# Patient Record
Sex: Female | Born: 1995 | Race: Black or African American | Hispanic: No | Marital: Single
Health system: Midwestern US, Academic
[De-identification: ages and names within clinical notes are randomized; demographics above are authoritative.]

## PROBLEM LIST (undated history)

## (undated) ENCOUNTER — Inpatient Hospital Stay (HOSPITAL_COMMUNITY): Payer: Self-pay

## (undated) DIAGNOSIS — H539 Unspecified visual disturbance: Secondary | ICD-10-CM

## (undated) DIAGNOSIS — F909 Attention-deficit hyperactivity disorder, unspecified type: Secondary | ICD-10-CM

## (undated) DIAGNOSIS — F99 Mental disorder, not otherwise specified: Secondary | ICD-10-CM

## (undated) DIAGNOSIS — Q249 Congenital malformation of heart, unspecified: Secondary | ICD-10-CM

## (undated) DIAGNOSIS — A749 Chlamydial infection, unspecified: Secondary | ICD-10-CM

## (undated) DIAGNOSIS — F419 Anxiety disorder, unspecified: Secondary | ICD-10-CM

## (undated) HISTORY — PX: CARDIAC SURGERY: SHX584

## (undated) HISTORY — DX: Chlamydial infection, unspecified: A74.9

## (undated) HISTORY — PX: NO PAST SURGERIES: SHX2092

## (undated) HISTORY — DX: Anxiety disorder, unspecified: F41.9

## (undated) HISTORY — DX: Mental disorder, not otherwise specified: F99

## (undated) HISTORY — DX: Congenital malformation of heart, unspecified: Q24.9

---
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
## 2016-08-25 ENCOUNTER — Encounter (HOSPITAL_COMMUNITY): Payer: Self-pay

## 2016-09-11 IMAGING — US US MFM OB DETAIL+14 WK
1 of 2 series · 13 of 28 positions shown · non-contrast
Comparison: none

[Series 1: us mfm ob detail+14 wk · 131 acquisitions, 13 frames shown]
[im 6/131]
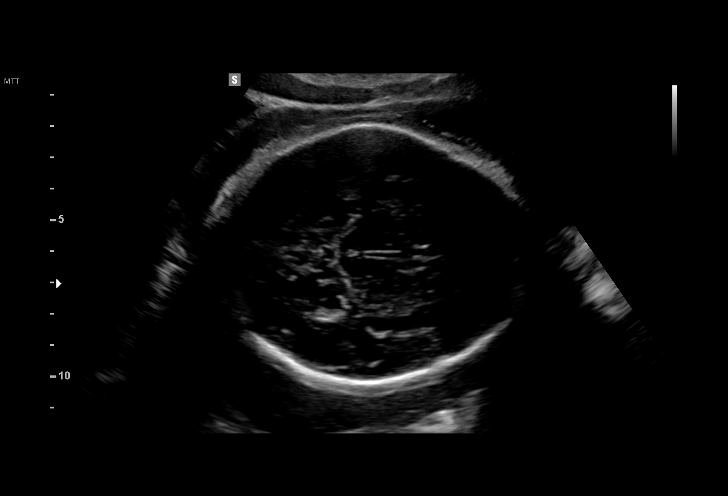
[im 16/131]
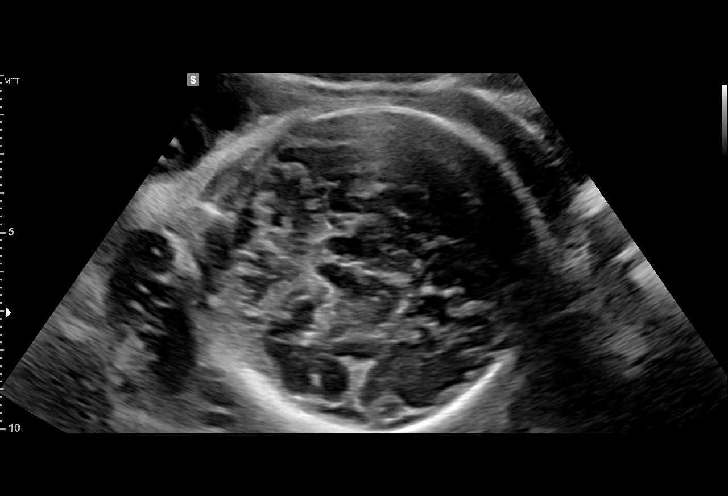
[im 26/131]
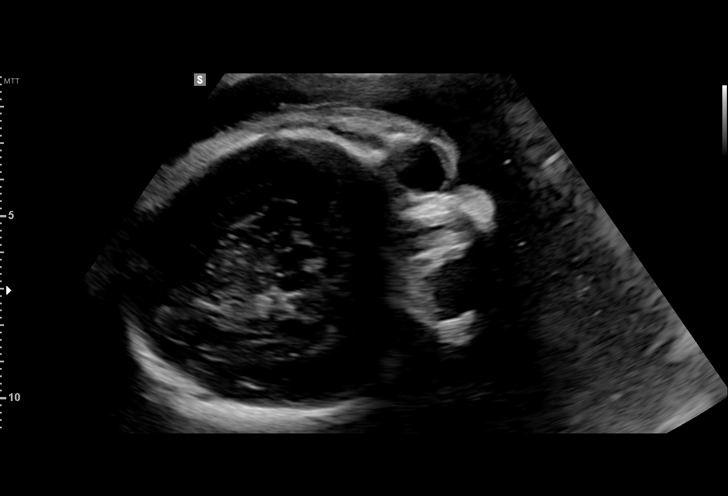
[im 36/131]
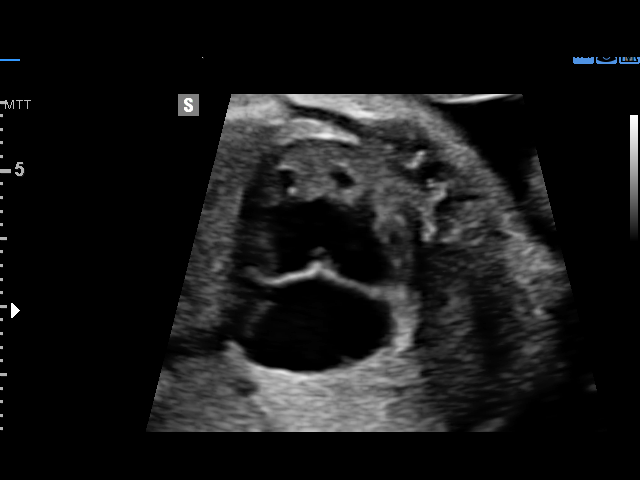
[im 46/131]
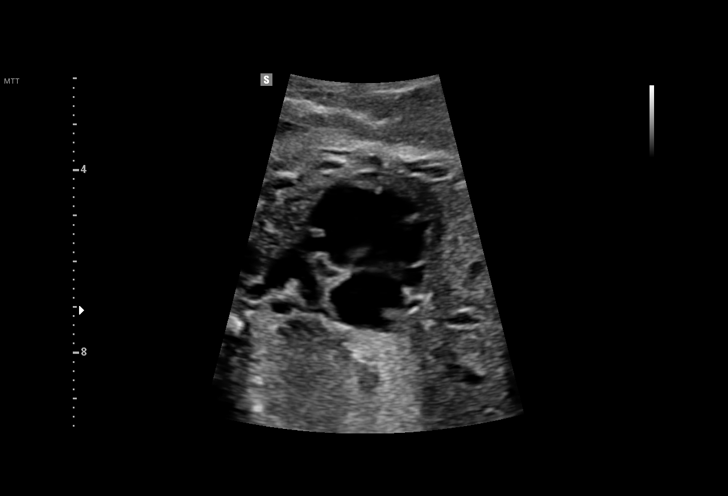
[im 56/131]
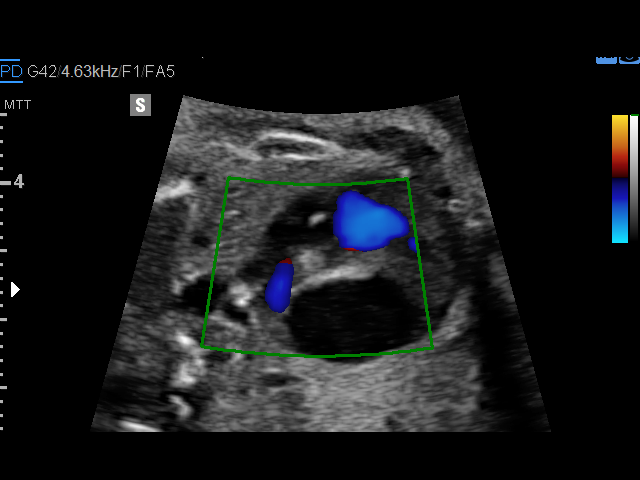
[im 71/131]
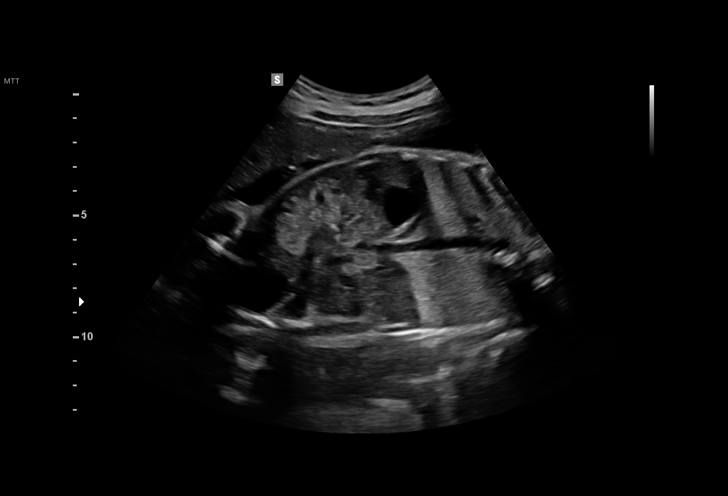
[im 81/131]
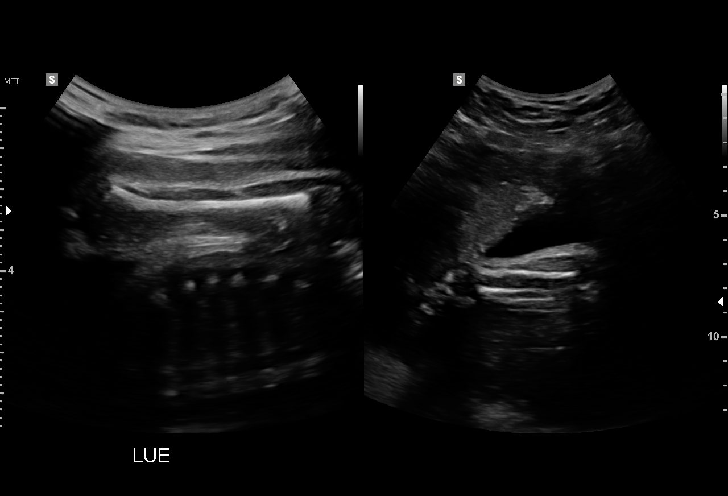
[im 91/131]
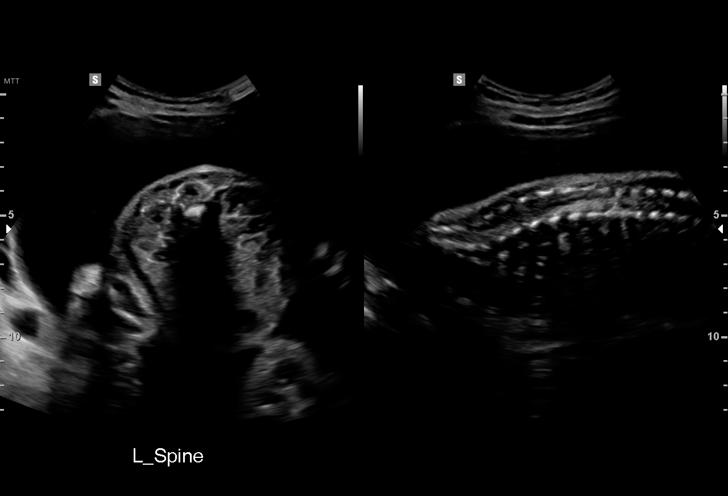
[im 101/131]
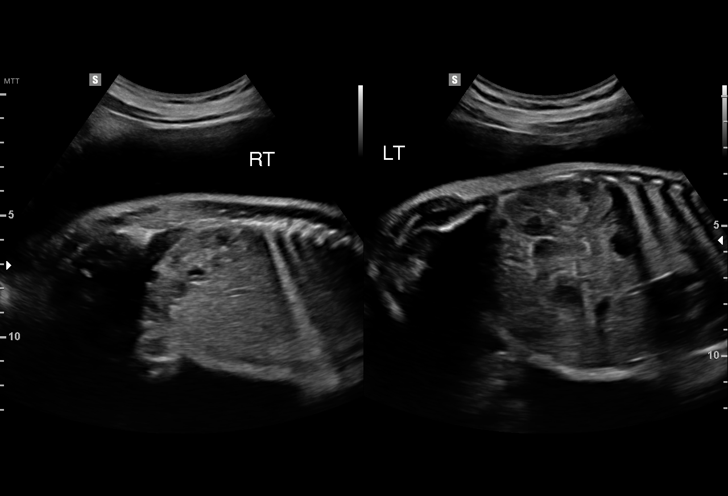
[im 111/131]
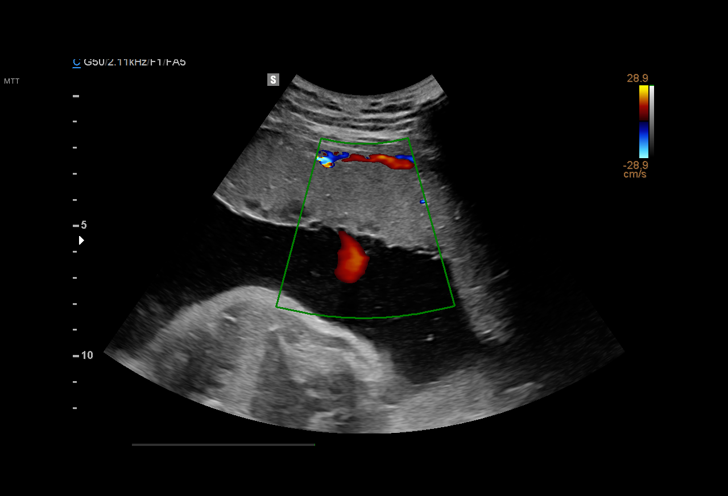
[im 121/131]
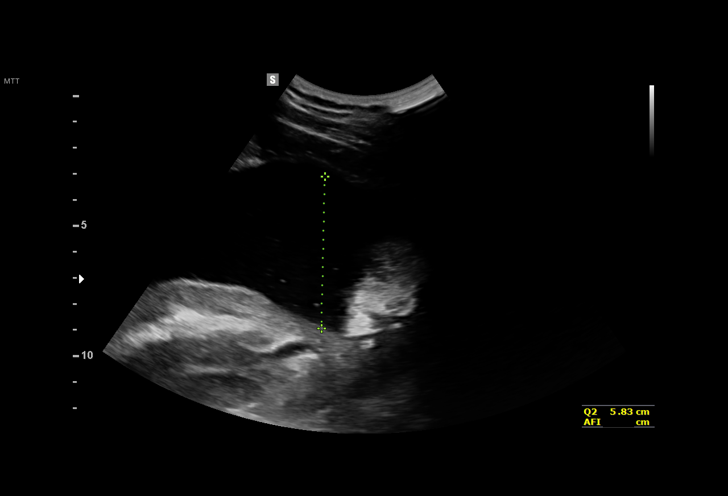
[im 131/131]
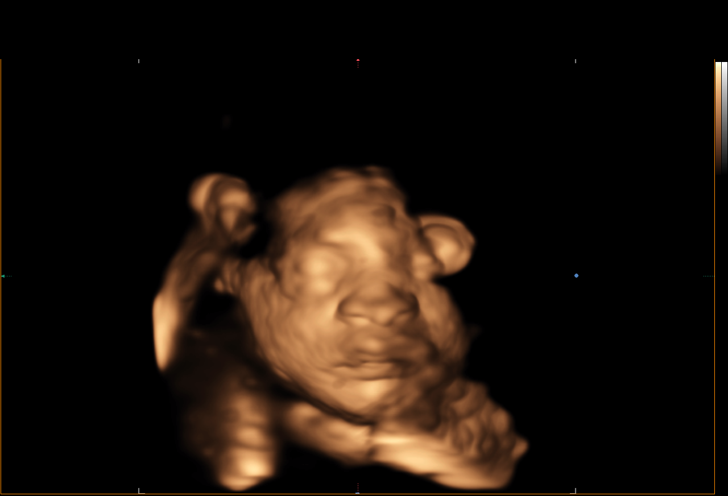

[13 of 28 positions shown; findings below may reference images not displayed]

OB/Gyn Clinic
[REDACTED]-
Faculty Physician

1  SAMPATH PADMANABHAN          [id number]      [id number]     [id number]
Indications

32 weeks gestation of pregnancy
Basic anatomic survey                          Z36
Late to prenatal care, third trimester
Other mental disorder complicating
pregnancy, unspecified trimester
Uncertain LMP,  Establish Gestational Age      Z36
Drug use complicating pregnancy,
unspecified trimester (THC)
Poor obstetric history-Recurrent (habitual)
abortion (4) consecutive ab's)
OB History

Blood Type:            Height:  5'4"   Weight (lb):  154      BMI:
Gravidity:    1         Term:   0        Prem:   0        SAB:   0
TOP:          0       Ectopic:  0        Living: 0
Fetal Evaluation

Num Of Fetuses:     1
Fetal Heart         128
Rate(bpm):
Cardiac Activity:   Observed
Presentation:       Cephalic
Placenta:           Anterior, above cervical os
P. Cord Insertion:  Visualized, central
Amniotic Fluid
AFI FV:      Subjectively upper-normal

AFI Sum(cm)     %Tile       Largest Pocket(cm)
24.02           94          8

RUQ(cm)       RLQ(cm)       LUQ(cm)        LLQ(cm)
5.01          5.18          5.83           8
Biometry

BPD:      82.1  mm     G. Age:  33w 0d         71  %    CI:        80.51   %   70 - 86
FL/HC:      19.3   %   19.1 -
HC:       289   mm     G. Age:  31w 6d         12  %    HC/AC:      1.10       0.96 -
AC:      262.6  mm     G. Age:  30w 3d         10  %    FL/BPD:     68.0   %   71 - 87
FL:       55.8  mm     G. Age:  29w 3d        < 3  %    FL/AC:      21.2   %   20 - 24
HUM:      48.8  mm     G. Age:  28w 5d        < 5  %
CER:      40.3  mm     G. Age:  34w 2d         80  %
CM:        8.8  mm

Est. FW:    5277  gm      3 lb 7 oz     25  %
Gestational Age

LMP:           30w 5d       Date:   04/15/15                 EDD:   01/20/16
U/S Today:     31w 1d                                        EDD:   01/17/16
Best:          32w 0d    Det. By:   Previous Ultrasound      EDD:   01/11/16
(08/19/15)
Anatomy

Cranium:               Appears normal         LVOT:                   Hypoplastic
Cavum:                 Appears normal         Aortic Arch:            Abnormal, see
comments
Ventricles:            Appears normal         Ductal Arch:            Appears normal
Choroid Plexus:        Appears normal         Diaphragm:              Appears normal
Cerebellum:            Appears normal         Stomach:                Appears normal, left
sided
Posterior Fossa:       Appears normal         Abdomen:                Appears normal
Nuchal Fold:           Not applicable (>20    Abdominal Wall:         Appears nml (cord
wks GA)                                        insert, abd wall)
Face:                  Appears normal         Cord Vessels:           2 vessel cord,
(orbits and profile)
absent right Jalver
[place]
Lips:                  Appears normal         Kidneys:                Appear normal
Palate:                Appears normal         Bladder:                Appears normal
Thoracic:              Appears normal         Spine:                  Appears normal
Heart:                 Abnormal, see          Upper Extremities:      Visualized
comments
RVOT:                  Appears normal         Lower Extremities:      Visualized

Other:  Fetus appears to be a male. Nasal bone visualized. Technically
difficult due to advanced GA and fetal position.
Cervix Uterus Adnexa

Cervix
Not visualized (advanced GA >98wks)
Uterus
No abnormality visualized.

Left Ovary
Size(cm)     2.91  x    1.56   x  1.32      Vol(ml):
Within normal limits. No adnexal mass visualized.

Right Ovary
Size(cm)     3.06  x    1.83   x  1.82      Vol(ml):
Within normal limits. No adnexal mass visualized.

Cul De Sac:   No free fluid seen.

Adnexa:       No abnormality visualized.
Impression

SIUP at 32+0 weeks
Suspect hypoplastic left heart syndrome
All other detailed fetal anatomy was seen and appeared
normal except for a single umbilcial artery
Normal amniotic fluid volume
Measurements consistent with prior US (performed in ER,
only BPD measured); EFW at the 25th %tile

The US findings were shared with Ms. Talasbaeva. The
implications of HLHS were discussed in detail. Then, she met
with our genetic counselor, Jumper. All of her prenatal testing
and pregnancy management options were reviewed. After
careful consideration, she declined amniocentesis and cell
free DNA screening.
Recommendations

Fetal ECHO scheduled at [facility]
Follow-up ultrasound for growth on [DATE]

## 2017-04-02 ENCOUNTER — Emergency Department: Admit: 2017-04-02 | Discharge: 2017-04-02 | Disposition: A | Discharge status: Home / Self Care

## 2017-04-02 DIAGNOSIS — S3992XA Unspecified injury of lower back, initial encounter: Principal | ICD-10-CM

## 2017-04-02 DIAGNOSIS — S299XXA Unspecified injury of thorax, initial encounter: ICD-10-CM

## 2017-04-02 MED ORDER — KETOROLAC 60 MG/2 ML IM SOLN
60 mg | Freq: Once | INTRAMUSCULAR | 0 refills | Status: CP
Start: 2017-04-02 — End: ?
  Administered 2017-04-02: 09:00:00 60 mg via INTRAMUSCULAR

## 2017-04-02 MED ORDER — CYCLOBENZAPRINE 10 MG PO TAB
10 mg | ORAL_TABLET | Freq: Three times a day (TID) | ORAL | 0 refills | 30.00000 days | Status: AC | PRN
Start: 2017-04-02 — End: ?

## 2017-04-02 MED ORDER — CYCLOBENZAPRINE 10 MG PO TAB
10 mg | Freq: Once | ORAL | 0 refills | Status: CP
Start: 2017-04-02 — End: ?
  Administered 2017-04-02: 09:00:00 10 mg via ORAL

## 2017-04-02 MED ORDER — NAPROXEN 500 MG PO TAB
500 mg | ORAL_TABLET | Freq: Two times a day (BID) | ORAL | 0 refills | Status: AC
Start: 2017-04-02 — End: ?
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
## 2017-04-02 NOTE — ED Notes
Discharge instructions reviewed with patient. Clarified with Dr. Scharlene Cornhom patient required no further monitoring post medication administration. Patient getting dressed for discharge.

## 2019-04-01 ENCOUNTER — Encounter: Admit: 2019-04-01 | Discharge: 2019-04-01

## 2019-04-01 ENCOUNTER — Emergency Department: Admit: 2019-04-01 | Discharge: 2019-04-01 | Disposition: A | Discharge status: Home / Self Care

## 2019-04-01 DIAGNOSIS — N760 Acute vaginitis: Secondary | ICD-10-CM

## 2019-04-01 DIAGNOSIS — N730 Acute parametritis and pelvic cellulitis: Secondary | ICD-10-CM

## 2019-04-01 DIAGNOSIS — N3001 Acute cystitis with hematuria: Secondary | ICD-10-CM

## 2019-04-01 DIAGNOSIS — A6009 Herpesviral infection of other urogenital tract: Secondary | ICD-10-CM

## 2019-04-01 LAB — URINALYSIS DIPSTICK REFLEX TO CULTURE
Lab: NEGATIVE — AB
Lab: NEGATIVE
Lab: NEGATIVE
Lab: NEGATIVE
Lab: POSITIVE — AB

## 2019-04-01 LAB — DIRECT EXAM (WET PREP)

## 2019-04-01 LAB — URINALYSIS MICROSCOPIC REFLEX TO CULTURE

## 2019-04-01 MED ORDER — CEPHALEXIN 500 MG PO CAP
500 mg | ORAL_CAPSULE | Freq: Two times a day (BID) | ORAL | 0 refills | Status: AC
Start: 2019-04-01 — End: ?

## 2019-04-01 MED ORDER — VALACYCLOVIR 500 MG PO TAB
1000 mg | Freq: Once | ORAL | 0 refills | Status: CP
Start: 2019-04-01 — End: ?
  Administered 2019-04-01: 20:00:00 1000 mg via ORAL

## 2019-04-01 MED ORDER — CEFTRIAXONE/LIDOCAINE IM 250MG VIAL MIXTURE
250 mg | Freq: Once | INTRAMUSCULAR | 0 refills | Status: CP
Start: 2019-04-01 — End: ?
  Administered 2019-04-01 (×2): 250 mg via INTRAMUSCULAR

## 2019-04-01 MED ORDER — DOXYCYCLINE HYCLATE 100 MG PO TAB
100 mg | Freq: Once | ORAL | 0 refills | Status: CP
Start: 2019-04-01 — End: ?
  Administered 2019-04-01: 20:00:00 100 mg via ORAL

## 2019-04-01 MED ORDER — METRONIDAZOLE 500 MG PO TAB
500 mg | ORAL_TABLET | Freq: Two times a day (BID) | ORAL | 0 refills | Status: AC
Start: 2019-04-01 — End: ?

## 2019-04-01 MED ORDER — VALACYCLOVIR 1 GRAM PO TAB
1000 mg | ORAL_TABLET | Freq: Two times a day (BID) | ORAL | 0 refills | Status: AC
Start: 2019-04-01 — End: ?

## 2019-04-01 MED ORDER — DOXYCYCLINE HYCLATE 100 MG PO TAB
100 mg | ORAL_TABLET | Freq: Two times a day (BID) | ORAL | 0 refills | 8.00000 days | Status: AC
Start: 2019-04-01 — End: ?

## 2019-04-01 MED ORDER — METRONIDAZOLE 500 MG PO TAB
500 mg | Freq: Once | ORAL | 0 refills | Status: CP
Start: 2019-04-01 — End: ?
  Administered 2019-04-01: 20:00:00 500 mg via ORAL

## 2019-04-01 MED ADMIN — AMMONIA AROMATIC 15 % (W/V) IN SOLN [439]: RESPIRATORY_TRACT | @ 17:00:00 | Stop: 2019-04-01 | NDC 39822990002

## 2019-04-01 NOTE — ED Notes
Kiara Lozano is a 23 y.o. female who presents to ED with CC of STD Exposure. Pt states she has been "messing around" with her best friend whose girlfriend has stated she has at least 3 STDs. Pt states she has white/yellow "spots" on her labia and into her "butt crack." Denies pruritis, nauesa, vomiting, abdominal pain, or other concerns. Pt does endorse white discharge from vagina, but states this has been going on for a long time. Pt endorses polysubstance abuse, alcohol use, and tobacco use. Pt resting on cart in lowest and locked position with call light in reach. No needs identified at this time.    History reviewed. No pertinent past medical history.    Belongings include: 1 hat, 1 shirt, 1 pants, 2 socks, 2 shoes, 1 cell phone,1 headphones

## 2019-04-01 NOTE — ED Notes
Pt refusing d/c VS. Pt educated regarding STI transmission and prophylaxis given. Pt is alert and oriented at this time. Ambulatory with steady gait.

## 2019-04-01 NOTE — ED Notes
Pt provided d/c paperwork and prescriptions. Re-educated on importance of follow up and condom use during intercourse. Pt denies questions at this time. Ambulates out of dept with steady gait unassisted.

## 2019-04-04 ENCOUNTER — Encounter: Admit: 2019-04-04 | Discharge: 2019-04-04

## 2019-04-04 NOTE — Progress Notes
Received urine culture report today from patient's ED visit on 04/01/2019, reported indicate a UTI, she was treated appropriately with antibiotic based on the sensitivity report.
